# Patient Record
Sex: Female | Born: 1965
Health system: Southern US, Community
[De-identification: ages and names within clinical notes are randomized; demographics above are authoritative.]

## PROBLEM LIST (undated history)

## (undated) DIAGNOSIS — G629 Polyneuropathy, unspecified: Secondary | ICD-10-CM

## (undated) DIAGNOSIS — F329 Major depressive disorder, single episode, unspecified: Secondary | ICD-10-CM

## (undated) DIAGNOSIS — Z5189 Encounter for other specified aftercare: Secondary | ICD-10-CM

## (undated) DIAGNOSIS — A048 Other specified bacterial intestinal infections: Secondary | ICD-10-CM

## (undated) DIAGNOSIS — E1142 Type 2 diabetes mellitus with diabetic polyneuropathy: Secondary | ICD-10-CM

## (undated) DIAGNOSIS — F32A Depression, unspecified: Secondary | ICD-10-CM

## (undated) DIAGNOSIS — I10 Essential (primary) hypertension: Secondary | ICD-10-CM

## (undated) DIAGNOSIS — E119 Type 2 diabetes mellitus without complications: Secondary | ICD-10-CM

## (undated) HISTORY — PX: UMBILICAL HERNIA REPAIR: SHX196

## (undated) HISTORY — DX: Depression, unspecified: F32.A

## (undated) HISTORY — PX: TUBAL LIGATION: SHX77

## (undated) HISTORY — DX: Essential (primary) hypertension: I10

## (undated) HISTORY — DX: Polyneuropathy, unspecified: G62.9

## (undated) HISTORY — DX: Other specified bacterial intestinal infections: A04.8

## (undated) HISTORY — DX: Encounter for other specified aftercare: Z51.89

## (undated) HISTORY — DX: Major depressive disorder, single episode, unspecified: F32.9

## (undated) HISTORY — DX: Type 2 diabetes mellitus with diabetic polyneuropathy: E11.42

---

## 2017-02-02 HISTORY — PX: COLONOSCOPY: SHX174

## 2018-10-20 ENCOUNTER — Encounter: Payer: Self-pay | Admitting: Gastroenterology

## 2018-11-02 ENCOUNTER — Encounter: Payer: Self-pay | Admitting: Gastroenterology

## 2018-11-02 ENCOUNTER — Telehealth (INDEPENDENT_AMBULATORY_CARE_PROVIDER_SITE_OTHER): Payer: Managed Care, Other (non HMO) | Admitting: Gastroenterology

## 2018-11-02 ENCOUNTER — Other Ambulatory Visit: Payer: Self-pay

## 2018-11-02 ENCOUNTER — Encounter: Payer: Self-pay | Admitting: *Deleted

## 2018-11-02 ENCOUNTER — Telehealth: Payer: Self-pay | Admitting: Gastroenterology

## 2018-11-02 VITALS — Ht <= 58 in | Wt 190.0 lb

## 2018-11-02 DIAGNOSIS — R1012 Left upper quadrant pain: Secondary | ICD-10-CM

## 2018-11-02 DIAGNOSIS — K760 Fatty (change of) liver, not elsewhere classified: Secondary | ICD-10-CM

## 2018-11-02 DIAGNOSIS — K219 Gastro-esophageal reflux disease without esophagitis: Secondary | ICD-10-CM

## 2018-11-02 NOTE — Telephone Encounter (Signed)
Pt needs a letter for her work indicating that she had a virtual visit today, pls fax letter to [phone].

## 2018-11-02 NOTE — Telephone Encounter (Signed)
Letter faxed to number provided.

## 2018-11-02 NOTE — Patient Instructions (Signed)
To help prevent the possible spread of infection to our patients, communities, and staff; we will be implementing the following measures:  As of now we are not allowing any visitors/family members to accompany you to any upcoming appointments with [facility]. If you have any concerns about this please contact our office to discuss prior to the appointment.   Continue taking Protonix 40mg  by mouth daily.  Try to exercise with the intent of losing 6 pounds over the next 3 months.  You have been scheduled for an endoscopy. Please follow written instructions given to you at your visit today. If you use inhalers (even only as needed), please bring them with you on the day of your procedure. Your physician has requested that you go to www.startemmi.com and enter the access code given to you at your visit today. This web site gives a general overview about your procedure. However, you should still follow specific instructions given to you by our office regarding your preparation for the procedure.  Thank you,  Dr. Lynann Bologna

## 2018-11-02 NOTE — Progress Notes (Signed)
Chief Complaint:   Referring Provider:  [name], MD      ASSESSMENT AND PLAN;   #1.  LUQ abdominal pain #2.  GERD. #3.  Fatty liver. Has assoc DM2, obesity.  Plan: -EGD for further evaluation.  Have discussed risks and benefits. -Continue Protonix 40 mg p.o. QD. -If EGD is negative, proceed with CT Abdo/pelvis with p.o. and IV contrast. -If still with problems, solid-phase GES to r/o diabetic gastroparesis. -I have encouraged her to start exercising and try to reduce weight.  Aim is to reduce 6lb over the next 3 months. -Best possible control of diabetes. -FU in 12 weeks.  At FU, repeat LFTs, check wt, if with abn LFTs, proceed with hepatic elastography.    HPI:    Terry Maynard is a 53 y.o. female  With left upper quadrant abdominal pain, feels like a "bulge" when she bends. Associated with some nausea, burping, regurgitation.  No odynophagia or dysphagia.  Heartburn is better ever since she has been on Protonix. Advised to get EGD performed. No significant weight loss.  In fact she has gained some weight. Diabetes not under very good control.  Her last hemoglobin A1c was 8.5 on 10/20/2018.  No vomiting, melena or hematochezia, diarrhea or constipation.  Daughter acting as Equities trader.  Past GI procedures: -Colonoscopy 02/02/2017-mild sigmoid diverticulosis, good preparation, repeat in 10 years. -CT 08/2015 some inflammatory stranding along previous umbilical hernia repair, fatty liver, status post cholecystectomy, no acute abnormalities.  No small bowel obstruction. - Past Medical History:  Diagnosis Date  . Depression   . Diabetic peripheral neuropathy (HCC)   . Essential hypertension     Past Surgical History:  Procedure Laterality Date  . COLONOSCOPY  02/02/2017   Mild sigmoid diverticulosis. Otherwise, normal colonoscopy  . TUBAL LIGATION    . UMBILICAL HERNIA REPAIR      Family History  Problem Relation Age of Onset  . Diabetes Mother   .  Diabetes Father   . Colon cancer Neg Hx   . Esophageal cancer Neg Hx     Social History   Tobacco Use  . Smoking status: Never Smoker  . Smokeless tobacco: Never Used  Substance Use Topics  . Alcohol use: Not Currently  . Drug use: Never    Current Outpatient Medications  Medication Sig Dispense Refill  . Insulin Glargine (TOUJEO SOLOSTAR Terry Maynard) Inject into the skin.    Marland Kitchen lisinopril-hydrochlorothiazide (ZESTORETIC) 20-25 MG tablet Take 1 tablet by mouth daily.    . metFORMIN (GLUCOPHAGE) 500 MG tablet Take 1 tablet by mouth 2 (two) times daily.    . pantoprazole (PROTONIX) 40 MG tablet Take 40 mg by mouth daily.     No current facility-administered medications for this visit.     Not on File  Review of Systems:  Constitutional: Denies fever, chills, diaphoresis, appetite change and fatigue.  HEENT: Denies photophobia, eye pain, redness, hearing loss, ear pain, congestion, sore throat, rhinorrhea, sneezing, mouth sores, neck pain, neck stiffness and tinnitus.   Respiratory: Denies SOB, DOE, cough, chest tightness,  and wheezing.   Cardiovascular: Denies chest pain, palpitations and leg swelling.  Genitourinary: Denies dysuria, urgency, frequency, hematuria, flank pain and difficulty urinating.  Musculoskeletal: Denies myalgias, back pain, joint swelling, arthralgias and gait problem.  Skin: No rash.  Neurological: Denies dizziness, seizures, syncope, weakness, light-headedness, numbness and headaches.  Hematological: Denies adenopathy. Easy bruising, personal or family bleeding history  Psychiatric/Behavioral: No anxiety or depression  Physical Exam:    Ht 4\' 8"  (1.422 m)   Wt 190 lb (86.2 kg) Comment: verbalized by daughter  BMI 42.60 kg/m  Filed Weights   11/02/18 1002  Weight: 190 lb (86.2 kg)   Constitutional:  Well-developed, in no acute distress. Psychiatric: Normal mood and affect. Behavior is normal.  This service was provided via telemedicine.  The  patient was located at home.  The provider was located in office.  The patient did consent to this telephone visit and is aware of possible charges through their insurance for this visit.  The patient was referred by Dr. Leonor LivHolt.  The other persons participating in this telemedicine service were daughter and their role was care/interpretation.  Time spent on call/coordination of care: 25 min     [name], MD 11/02/2018, 10:55 AM  Cc: Marylen PontoHolt, Lynley S, MD

## 2018-11-10 ENCOUNTER — Telehealth: Payer: Self-pay | Admitting: *Deleted

## 2018-11-10 NOTE — Telephone Encounter (Signed)
Covid-19 screening questions  Have you traveled in the last 14 days? NO If yes where?  Do you now or have you had a fever in the last 14 days? NO  Do you have any respiratory symptoms of shortness of breath or cough now or in the last 14 days? NO  Do you have any family members or close contacts with diagnosed or suspected Covid-19 in the past 14 days? NO  Have you been tested for Covid-19 and found to be positive? NO  Pt told that care partner would wait in the car but we request that they stay on the premises.  Also asked pt to bring a mask and if they dont have one we will provide one.

## 2018-11-10 NOTE — Telephone Encounter (Signed)
Covid-19 screening questions  Have you traveled in the last 14 days? If yes where?  Do you now or have you had a fever in the last 14 days?  Do you have any respiratory symptoms of shortness of breath or cough now or in the last 14 days?  Do you have any family members or close contacts with diagnosed or suspected Covid-19 in the past 14 days?  Have you been tested for Covid-19 and found to be positive?  Pt told that care partner would wait in the car but we request that they stay on the premises.  Also asked pt to bring a mask and if they dont have one we will provide one.

## 2018-11-13 ENCOUNTER — Encounter: Payer: Self-pay | Admitting: Gastroenterology

## 2018-11-13 ENCOUNTER — Other Ambulatory Visit: Payer: Self-pay

## 2018-11-13 ENCOUNTER — Ambulatory Visit (AMBULATORY_SURGERY_CENTER): Payer: Managed Care, Other (non HMO) | Admitting: Gastroenterology

## 2018-11-13 VITALS — BP 115/72 | HR 72 | Temp 98.4°F | Resp 19 | Ht <= 58 in | Wt 189.0 lb

## 2018-11-13 DIAGNOSIS — B9681 Helicobacter pylori [H. pylori] as the cause of diseases classified elsewhere: Secondary | ICD-10-CM | POA: Diagnosis not present

## 2018-11-13 DIAGNOSIS — R1012 Left upper quadrant pain: Secondary | ICD-10-CM

## 2018-11-13 DIAGNOSIS — K2970 Gastritis, unspecified, without bleeding: Secondary | ICD-10-CM

## 2018-11-13 MED ORDER — SODIUM CHLORIDE 0.9 % IV SOLN: 500.0000 mL | Freq: Once | INTRAVENOUS | Status: DC

## 2018-11-13 NOTE — Progress Notes (Signed)
Called to room to assist during endoscopic procedure.  Patient ID and intended procedure confirmed with present staff. Received instructions for my participation in the procedure from the performing physician.  

## 2018-11-13 NOTE — Progress Notes (Signed)
PT taken to PACU. Monitors in place. VSS. Report given to RN. 

## 2018-11-13 NOTE — Op Note (Signed)
[facility] Patient Name: Terry Maynard Procedure Date: 11/13/2018 9:30 AM MRN: [id number] Endoscopist: Lynann Bologna , MD Age: 53 Referring MD:  Date of Birth: [birth year] Gender: Female Account #: [id number] Procedure:                Upper GI endoscopy Indications:              Abdominal pain in the left upper quadrant Medicines:                Monitored Anesthesia Care Procedure:                Pre-Anesthesia Assessment:                           - Prior to the procedure, a History and Physical                            was performed, and patient medications and                            allergies were reviewed. The patient's tolerance of                            previous anesthesia was also reviewed. The risks                            and benefits of the procedure and the sedation                            options and risks were discussed with the patient.                            All questions were answered, and informed consent                            was obtained. Prior Anticoagulants: The patient has                            taken no previous anticoagulant or antiplatelet                            agents. ASA Grade Assessment: III - A patient with                            severe systemic disease. After reviewing the risks                            and benefits, the patient was deemed in                            satisfactory condition to undergo the procedure.                           After obtaining informed consent, the endoscope was  passed under direct vision. Throughout the                            procedure, the patient's blood pressure, pulse, and                            oxygen saturations were monitored continuously. The                            Endoscope was introduced through the mouth, and                            advanced to the second part of duodenum. The upper                            GI  endoscopy was accomplished without difficulty.                            The patient tolerated the procedure well. Scope In: Scope Out: Findings:                 The examined esophagus was normal.                           The Z-line was regular and was found 35 cm from the                            incisors.                           Localized mild inflammation characterized by                            erythema was found in the gastric antrum. Biopsies                            were taken with a cold forceps for histology.                            Estimated blood loss: none.                           The examined duodenum was normal. Biopsies were                            taken with a cold forceps for histology. Estimated                            blood loss: none. Complications:            No immediate complications. Estimated Blood Loss:     Estimated blood loss: none. Impression:               -Mild gastritis. Recommendation:           - Patient has a contact number available for  emergencies. The signs and symptoms of potential                            delayed complications were discussed with the                            patient. Return to normal activities tomorrow.                            Written discharge instructions were provided to the                            patient.                           - Resume previous diet.                           - Continue present medications including Protonix                            40 mg p.o. once a day.                           - Avoid nonsteroidals if possible.                           - Await pathology results.                           - Return to GI clinic in 6 weeks. Lynann Bolognaajesh Yosgar Demirjian, MD 11/13/2018 9:45:12 AM This report has been signed electronically.

## 2018-11-13 NOTE — Patient Instructions (Signed)
Take your new medicine per day on an empty stomach. YOU HAD AN ENDOSCOPIC PROCEDURE TODAY AT THE [facility]:   Refer to the procedure report that was given to you for any specific questions about what was found during the examination.  If the procedure report does not answer your questions, please call your gastroenterologist to clarify.  If you requested that your care partner not be given the details of your procedure findings, then the procedure report has been included in a sealed envelope for you to review at your convenience later.  YOU SHOULD EXPECT: Some feelings of bloating in the abdomen. Passage of more gas than usual.  Walking can help get rid of the air that was put into your GI tract during the procedure and reduce the bloating.   Please Note:  You might notice some irritation and congestion in your nose or some drainage.  This is from the oxygen used during your procedure.  There is no need for concern and it should clear up in a day or so.  SYMPTOMS TO REPORT IMMEDIATELY:    Following upper endoscopy (EGD)  Vomiting of blood or coffee ground material  New chest pain or pain under the shoulder blades  Painful or persistently difficult swallowing  New shortness of breath  Fever of 100F or higher  Black, tarry-looking stools  For urgent or emergent issues, a gastroenterologist can be reached at any hour by calling [phone].   DIET:  We do recommend a small meal at first, but then you may proceed to your regular diet.  Drink plenty of fluids but you should avoid alcoholic beverages for 24 hours.  ACTIVITY:  You should plan to take it easy for the rest of today and you should NOT DRIVE or use heavy machinery until tomorrow (because of the sedation medicines used during the test).    FOLLOW UP: Our staff will call the number listed on your records 48-72 hours following your procedure to check on you and address any questions or concerns that you may have  regarding the information given to you following your procedure. If we do not reach you, we will leave a message.  We will attempt to reach you two times.  During this call, we will ask if you have developed any symptoms of COVID 19. If you develop any symptoms (ie: fever, flu-like symptoms, shortness of breath, cough etc.) before then, please call [phone].  If you test positive for Covid 19 in the 2 weeks post procedure, please call and report this information to [place].    If any biopsies were taken you will be contacted by phone or by letter within the next 1-3 weeks.  Please call us at [phone] if you have not heard about the biopsies in 3 weeks.    SIGNATURES/CONFIDENTIALITY: You and/or your care partner have signed paperwork which will be entered into your electronic medical record.  These signatures attest to the fact that that the information above on your After Visit Summary has been reviewed and is understood.  Full responsibility of the confidentiality of this discharge information lies with you and/or your care-partner.

## 2018-11-15 ENCOUNTER — Telehealth: Payer: Self-pay

## 2018-11-15 NOTE — Telephone Encounter (Deleted)
  Follow up Call-  Call back number 11/13/2018  Post procedure Call Back phone  # [phone] daughter ok to talk to her  Permission to leave phone message Yes     Patient questions:  Do you have a fever, pain , or abdominal swelling? {yes no:314532} Pain Score  {NUMBERS; 0-10:5044} *  Have you tolerated food without any problems? {yes no:314532}  Have you been able to return to your normal activities? {yes no:314532}  Do you have any questions about your discharge instructions: Diet   {yes no:314532} Medications  {yes no:314532} Follow up visit  {yes no:314532}  Do you have questions or concerns about your Care? {yes no:314532}  Actions: * If pain score is 4 or above: {ACTION; [facility] ENDO PAIN >4:21563::"No action needed, pain <4."}  1. Have you developed a fever since your procedure? ***  2.   Have you had an respiratory symptoms (SOB or cough) since your procedure? ***  3.   Have you tested positive for COVID 19 since your procedure ***  4.   Have you had any family members/close contacts diagnosed with the COVID 19 since your procedure?  ***   If yes to any of these questions please route to Laverna Peace, RN and Jennye Boroughs, RN.

## 2018-11-15 NOTE — Telephone Encounter (Signed)
First attempt follow up call made, no answer, left message for pt to call if any problems or covid 19 sx but that we would call back after noon today to check again.

## 2018-11-15 NOTE — Telephone Encounter (Signed)
Second follow up call attempt.  Reached voicemail with phone number identified.  Message left to call if any questions, concerns, or issues related to procedure or COVID-19.

## 2018-11-16 ENCOUNTER — Encounter: Payer: Self-pay | Admitting: Gastroenterology

## 2018-11-17 ENCOUNTER — Other Ambulatory Visit: Payer: Self-pay

## 2018-11-17 DIAGNOSIS — A048 Other specified bacterial intestinal infections: Secondary | ICD-10-CM

## 2018-11-17 MED ORDER — CLARITHROMYCIN 500 MG PO TABS: 500.0000 mg | ORAL_TABLET | Freq: Two times a day (BID) | ORAL | 0 refills | Status: DC

## 2018-11-17 MED ORDER — PANTOPRAZOLE SODIUM 40 MG PO TBEC: 40.0000 mg | DELAYED_RELEASE_TABLET | Freq: Every day | ORAL | 0 refills | Status: AC

## 2018-11-17 MED ORDER — METRONIDAZOLE 500 MG PO TABS: 500.0000 mg | ORAL_TABLET | Freq: Two times a day (BID) | ORAL | 0 refills | Status: DC

## 2018-11-17 MED ORDER — AMOXICILLIN 500 MG PO TABS: 1000.0000 mg | ORAL_TABLET | Freq: Two times a day (BID) | ORAL | 0 refills | Status: DC

## 2018-12-19 ENCOUNTER — Encounter: Payer: Self-pay | Admitting: Gastroenterology

## 2018-12-27 ENCOUNTER — Telehealth: Payer: Managed Care, Other (non HMO) | Admitting: Gastroenterology

## 2018-12-27 ENCOUNTER — Other Ambulatory Visit: Payer: Self-pay

## 2019-01-01 NOTE — Progress Notes (Signed)
H. Pylori ordered in Epic; letter to be translated for patient then to be mailed to the patient;

## 2019-01-16 ENCOUNTER — Telehealth: Payer: Self-pay | Admitting: Gastroenterology

## 2019-01-16 NOTE — Telephone Encounter (Signed)
Covid-19 Screening Questions ° °¿Tiene o ha tenido fiebre en los últimos 14 días?          NO °Do you now or have you had a fever in the last 14 days?  °¿Tiene algún síntoma respiratorio de dificultad para respirar o tos ahora o en los últimos 14 días?        NO °Do you have any respiratory symptoms of shortness of breath or cough now or in the last 14 days?         °¿Tiene algún familiar o contacto cercano con Covid-19 diagnosticado o sospechoso en los últimos 14 días?         NO °Do you have any family members or close contacts with diagnosed or suspected Covid-19 in the past 14 days?      °¿Te han hecho la prueba de Covid-19 y has encontrado que es positivo?      NO °Have you been tested for Covid-19 and found to be positive? °Se le pidió a la paciente que trajera a alguien que la ayudara a interpretar durante su visita de mañana. Tanto la intérprete como ella misma deben ingresar al edificio con una máscara. °Patient was asked to bring someone to help interpreter for her. Both interpreter and herself should enter the building wearing a mask. °

## 2019-01-17 ENCOUNTER — Other Ambulatory Visit: Payer: Self-pay

## 2019-01-17 ENCOUNTER — Ambulatory Visit: Payer: Managed Care, Other (non HMO) | Admitting: Gastroenterology

## 2019-01-17 ENCOUNTER — Encounter: Payer: Self-pay | Admitting: Gastroenterology

## 2019-01-17 VITALS — BP 134/90 | HR 89 | Temp 98.2°F | Ht 61.0 in | Wt 186.5 lb

## 2019-01-17 DIAGNOSIS — K760 Fatty (change of) liver, not elsewhere classified: Secondary | ICD-10-CM | POA: Diagnosis not present

## 2019-01-17 DIAGNOSIS — R1012 Left upper quadrant pain: Secondary | ICD-10-CM | POA: Diagnosis not present

## 2019-01-17 DIAGNOSIS — K219 Gastro-esophageal reflux disease without esophagitis: Secondary | ICD-10-CM

## 2019-01-17 NOTE — Progress Notes (Signed)
Chief Complaint: FU  Referring Provider:  Ronita Hipps, MD      ASSESSMENT AND PLAN;   #1.  LUQ abdominal pain. Neg EGD 11/2018 except for HP gastritis. Treated.  Could have musculoskeletal component as well. #2.  GERD. #3.  Fatty liver. Has assoc DM2, obesity.  Plan: -Continue Protonix 40 mg p.o. QD. -CT Abdo/pelvis with p.o. and IV contrast. -If still with problems, solid-phase GES to r/o diabetic gastroparesis. -Colace 1 tab po qd. -I have encouraged her to start exercising and try to reduce weight.  Aim is to reduce 6lb over the next 3 months. -Best possible control of diabetes. -Check CBC, CMP and lipase. -Heating pads and icy hot for musculoskeltal component. -FU in 12 weeks.     HPI:    Terry Maynard is a 53 y.o. female  With left upper quadrant abdominal pain, feels like a "bulge" when she bends.  Has some associated constipation.  She does admit that if she has good bowel movements, the pain does get better.  Also has been having abdominal pain of a different kind when she she moves from side to side.  Associated with some nausea, burping, regurgitation.  No odynophagia or dysphagia.  Heartburn is better ever since she has been on Protonix.  No significant weight loss.  In fact she has gained some weight.  Diabetes not under very good control.  Her last hemoglobin A1c was 8.5 on 10/20/2018.  No vomiting, melena or hematochezia, diarrhea.  History through interpreter.  Past GI procedures: -Colonoscopy 02/02/2017-mild sigmoid diverticulosis, good preparation, repeat in 10 years. -CT 08/2015 some inflammatory stranding along previous umbilical hernia repair, fatty liver, status post cholecystectomy, no acute abnormalities.  No small bowel obstruction. -EGD6/2020: HP gastritis.  Treated with triple drug therapy. Past Medical History:  Diagnosis Date  . Blood transfusion without reported diagnosis   . Depression   . Diabetic peripheral neuropathy ([place])    . Essential hypertension   . H. pylori infection   . Peripheral neuropathy     Past Surgical History:  Procedure Laterality Date  . COLONOSCOPY  02/02/2017   Mild sigmoid diverticulosis. Otherwise, normal colonoscopy  . TUBAL LIGATION    . UMBILICAL HERNIA REPAIR      Family History  Problem Relation Age of Onset  . Diabetes Mother   . Diabetes Father   . Colon cancer Neg Hx   . Esophageal cancer Neg Hx   . Colon polyps Neg Hx   . Rectal cancer Neg Hx   . Stomach cancer Neg Hx     Social History   Tobacco Use  . Smoking status: Never Smoker  . Smokeless tobacco: Never Used  Substance Use Topics  . Alcohol use: Not Currently  . Drug use: Never    Current Outpatient Medications  Medication Sig Dispense Refill  . FARXIGA 5 MG TABS tablet Take 5 mg by mouth every morning.    Marland Kitchen lisinopril-hydrochlorothiazide (ZESTORETIC) 20-25 MG tablet Take 1 tablet by mouth daily.    . metFORMIN (GLUCOPHAGE) 500 MG tablet Take 1 tablet by mouth 2 (two) times daily.    . pantoprazole (PROTONIX) 40 MG tablet Take 1 tablet (40 mg total) by mouth daily. 28 tablet 0   No current facility-administered medications for this visit.     No Known Allergies  Review of Systems:  neg     Physical Exam:    BP 134/90   Pulse 89   Temp 98.2 F (36.8  C)   Ht 5\' 1"  (1.549 m)   Wt 186 lb 8 oz (84.6 kg)   BMI 35.24 kg/m  Filed Weights   01/17/19 1544  Weight: 186 lb 8 oz (84.6 kg)  Wearing mask Lungs: Clear. CVS: Normal heart sounds, no murmurs. Abdominal exam: Left upper quadrant reproducible musculoskeletal tenderness.  Bowel sounds are present.  Minimal lower abdominal tenderness.  No rebound.  Bowel sounds are present. Extremities-no edema. Examined in presence of interpreter.  Time spent on call/coordination of care: 15 min     Edman Circleaj Annaliesa Blann, MD 01/17/2019, 4:09 PM  Cc: Marylen PontoHolt, Lynley S, MD

## 2019-01-17 NOTE — Patient Instructions (Addendum)
If you are age 55 or older, your body mass index should be between 23-30. Your Body mass index is 35.24 kg/m. If this is out of the aforementioned range listed, please consider follow up with your Primary Care Provider.  If you are age 32 or younger, your body mass index should be between 19-25. Your Body mass index is 35.24 kg/m. If this is out of the aformentioned range listed, please consider follow up with your Primary Care Provider.    You have [facility] [place] [address] [facility]   You are scheduled on 01/25/19 at [place] should arrive 15 minutes prior to your appointment time for registration. Please follow the written instructions below on the day of your exam:  WARNING: IF YOU ARE ALLERGIC TO IODINE/X-RAY DYE, PLEASE NOTIFY RADIOLOGY IMMEDIATELY AT [phone]! YOU WILL BE GIVEN A 13 HOUR PREMEDICATION PREP.  1) Do not eat or drink anything after 7am (4 hours prior to your test) 2) You have been given 2 bottles of oral contrast to drink. The solution may taste better if refrigerated, but do NOT add ice or any other liquid to this solution. Shake well before drinking.    Drink 1 bottle of contrast @ 9am (2 hours prior to your exam)  Drink 1 bottle of contrast @ 10am (1 hour prior to your exam)  You may take any medications as prescribed with a small amount of water, if necessary. If you take any of the following medications: METFORMIN, GLUCOPHAGE, GLUCOVANCE, AVANDAMET, RIOMET, FORTAMET, [place] MET, JANUMET, GLUMETZA or METAGLIP, you MAY be asked to HOLD this medication 48 hours AFTER the exam.  The purpose of you drinking the oral contrast is to aid in the visualization of your intestinal tract. The contrast solution may cause some diarrhea. Depending on your individual set of symptoms, you may also receive an intravenous injection of x-ray contrast/dye. Plan on being at  [facility] for 30 minutes or longer, depending on the type of exam you are having performed.  This test typically takes 30-45 minutes to complete.  If you have any questions regarding your exam or if you need to reschedule, you may call the CT department at [phone] between the hours of 8:00 am and 5:00 pm, Monday-Friday.  ________________________________________________________________________  Please go to the lab at [facility] ([place] You will need to go to level "B", you do not need an appointment for this. Hours available are 7:30 am - 4:30 pm.   Use heating pad at night and Icy Hot during day.  Try to reduce your weight by 6 lbs in the next 3 months.   Follow up 12 weeks.   Please purchase the following medications over the counter and take as directed: Colace once daily.   Thank you,  Dr. Jackquline Denmark

## 2019-01-24 ENCOUNTER — Other Ambulatory Visit (INDEPENDENT_AMBULATORY_CARE_PROVIDER_SITE_OTHER): Payer: Managed Care, Other (non HMO)

## 2019-01-24 DIAGNOSIS — R1012 Left upper quadrant pain: Secondary | ICD-10-CM | POA: Diagnosis not present

## 2019-01-24 DIAGNOSIS — K219 Gastro-esophageal reflux disease without esophagitis: Secondary | ICD-10-CM | POA: Diagnosis not present

## 2019-01-24 DIAGNOSIS — K760 Fatty (change of) liver, not elsewhere classified: Secondary | ICD-10-CM

## 2019-01-24 DIAGNOSIS — A048 Other specified bacterial intestinal infections: Secondary | ICD-10-CM

## 2019-01-24 LAB — CBC WITH DIFFERENTIAL/PLATELET
Basophils Absolute: 0 10*3/uL (ref 0.0–0.1)
Basophils Relative: 0.6 % (ref 0.0–3.0)
Eosinophils Absolute: 0.1 10*3/uL (ref 0.0–0.7)
Eosinophils Relative: 2.8 % (ref 0.0–5.0)
HCT: 44.7 % (ref 36.0–46.0)
Hemoglobin: 14.8 g/dL (ref 12.0–15.0)
Lymphocytes Relative: 31.7 % (ref 12.0–46.0)
Lymphs Abs: 1.7 10*3/uL (ref 0.7–4.0)
MCHC: 33 g/dL (ref 30.0–36.0)
MCV: 86.5 fl (ref 78.0–100.0)
Monocytes Absolute: 0.3 10*3/uL (ref 0.1–1.0)
Monocytes Relative: 6.2 % (ref 3.0–12.0)
Neutro Abs: 3.1 10*3/uL (ref 1.4–7.7)
Neutrophils Relative %: 58.7 % (ref 43.0–77.0)
Platelets: 181 10*3/uL (ref 150.0–400.0)
RBC: 5.17 Mil/uL — ABNORMAL HIGH (ref 3.87–5.11)
RDW: 13.1 % (ref 11.5–15.5)
WBC: 5.3 10*3/uL (ref 4.0–10.5)

## 2019-01-24 LAB — COMPREHENSIVE METABOLIC PANEL
ALT: 33 U/L (ref 0–35)
AST: 26 U/L (ref 0–37)
Albumin: 4.3 g/dL (ref 3.5–5.2)
Alkaline Phosphatase: 70 U/L (ref 39–117)
BUN: 16 mg/dL (ref 6–23)
CO2: 29 mEq/L (ref 19–32)
Calcium: 9.6 mg/dL (ref 8.4–10.5)
Chloride: 103 mEq/L (ref 96–112)
Creatinine, Ser: 0.67 mg/dL (ref 0.40–1.20)
GFR: 91.42 mL/min (ref >60.00)
Glucose, Bld: 153 mg/dL — ABNORMAL HIGH (ref 70–99)
Potassium: 3.8 mEq/L (ref 3.5–5.1)
Sodium: 139 mEq/L (ref 135–145)
Total Bilirubin: 0.4 mg/dL (ref 0.2–1.2)
Total Protein: 7.4 g/dL (ref 6.0–8.3)

## 2019-01-24 LAB — LIPASE: Lipase: 20 U/L (ref 11.0–59.0)

## 2019-01-25 ENCOUNTER — Other Ambulatory Visit: Payer: Self-pay

## 2019-01-25 ENCOUNTER — Ambulatory Visit (HOSPITAL_BASED_OUTPATIENT_CLINIC_OR_DEPARTMENT_OTHER)
Admission: RE | Admit: 2019-01-25 | Discharge: 2019-01-25 | Disposition: A | Discharge status: Home / Self Care | Payer: Managed Care, Other (non HMO) | Source: Ambulatory Visit | Attending: Gastroenterology | Admitting: Gastroenterology

## 2019-01-25 ENCOUNTER — Encounter (HOSPITAL_BASED_OUTPATIENT_CLINIC_OR_DEPARTMENT_OTHER): Payer: Self-pay

## 2019-01-25 DIAGNOSIS — R1012 Left upper quadrant pain: Secondary | ICD-10-CM | POA: Diagnosis not present

## 2019-01-25 DIAGNOSIS — K760 Fatty (change of) liver, not elsewhere classified: Secondary | ICD-10-CM | POA: Insufficient documentation

## 2019-01-25 DIAGNOSIS — K219 Gastro-esophageal reflux disease without esophagitis: Secondary | ICD-10-CM | POA: Insufficient documentation

## 2019-01-25 HISTORY — DX: Type 2 diabetes mellitus without complications: E11.9

## 2019-01-25 LAB — HELICOBACTER PYLORI  SPECIAL ANTIGEN
MICRO NUMBER:: 763880
SPECIMEN QUALITY: ADEQUATE

## 2019-01-25 MED ORDER — IOHEXOL 300 MG/ML  SOLN
100.0000 mL | Freq: Once | INTRAMUSCULAR | Status: AC | PRN
  Administered 2019-01-25: 100 mL via INTRAVENOUS

## 2019-02-06 ENCOUNTER — Other Ambulatory Visit: Payer: Self-pay

## 2019-02-06 DIAGNOSIS — R197 Diarrhea, unspecified: Secondary | ICD-10-CM

## 2019-02-06 DIAGNOSIS — R1012 Left upper quadrant pain: Secondary | ICD-10-CM

## 2019-02-06 DIAGNOSIS — K760 Fatty (change of) liver, not elsewhere classified: Secondary | ICD-10-CM

## 2019-02-06 DIAGNOSIS — K219 Gastro-esophageal reflux disease without esophagitis: Secondary | ICD-10-CM

## 2019-02-09 NOTE — Progress Notes (Signed)
Called and scheduled the patient's gastric emptying study at [facility] on 02/20/2019 at 7:30 am ; patient is to arrive at 7:15am, cannot take meds after midnight (especially stomach medications); patient is not to eat or drink after midnight as well; patient needs to be informed that this test is 4 hours in length;  Please inform the patient that she can call [phone] if she is unable to do this date/time;

## 2019-02-20 ENCOUNTER — Other Ambulatory Visit: Payer: Self-pay

## 2019-02-20 ENCOUNTER — Encounter (HOSPITAL_COMMUNITY)
Admission: RE | Admit: 2019-02-20 | Discharge: 2019-02-20 | Disposition: A | Discharge status: Home / Self Care | Payer: Managed Care, Other (non HMO) | Source: Ambulatory Visit | Attending: Gastroenterology | Admitting: Gastroenterology

## 2019-02-20 DIAGNOSIS — K760 Fatty (change of) liver, not elsewhere classified: Secondary | ICD-10-CM | POA: Diagnosis present

## 2019-02-20 DIAGNOSIS — R197 Diarrhea, unspecified: Secondary | ICD-10-CM | POA: Diagnosis not present

## 2019-02-20 DIAGNOSIS — K219 Gastro-esophageal reflux disease without esophagitis: Secondary | ICD-10-CM | POA: Diagnosis present

## 2019-02-20 DIAGNOSIS — R1012 Left upper quadrant pain: Secondary | ICD-10-CM | POA: Insufficient documentation

## 2019-02-20 MED ORDER — TECHNETIUM TC 99M SULFUR COLLOID
1.9000 | Freq: Once | INTRAVENOUS | Status: AC | PRN
  Administered 2019-02-20: 09:00:00 1.9 via INTRAVENOUS

## 2019-06-25 DIAGNOSIS — R1032 Left lower quadrant pain: Secondary | ICD-10-CM | POA: Diagnosis not present

## 2019-06-25 DIAGNOSIS — M25512 Pain in left shoulder: Secondary | ICD-10-CM | POA: Diagnosis not present

## 2019-06-25 DIAGNOSIS — M25511 Pain in right shoulder: Secondary | ICD-10-CM | POA: Diagnosis not present

## 2019-06-26 DIAGNOSIS — Z20828 Contact with and (suspected) exposure to other viral communicable diseases: Secondary | ICD-10-CM | POA: Diagnosis not present

## 2019-06-26 DIAGNOSIS — R1032 Left lower quadrant pain: Secondary | ICD-10-CM | POA: Diagnosis not present

## 2019-06-26 DIAGNOSIS — Z1152 Encounter for screening for COVID-19: Secondary | ICD-10-CM | POA: Diagnosis not present

## 2019-07-12 DIAGNOSIS — R109 Unspecified abdominal pain: Secondary | ICD-10-CM | POA: Diagnosis not present

## 2019-07-12 DIAGNOSIS — R197 Diarrhea, unspecified: Secondary | ICD-10-CM | POA: Diagnosis not present

## 2019-07-12 DIAGNOSIS — R1110 Vomiting, unspecified: Secondary | ICD-10-CM | POA: Diagnosis not present

## 2019-07-13 DIAGNOSIS — R109 Unspecified abdominal pain: Secondary | ICD-10-CM | POA: Diagnosis not present

## 2019-07-16 DIAGNOSIS — R112 Nausea with vomiting, unspecified: Secondary | ICD-10-CM | POA: Diagnosis not present

## 2019-07-16 DIAGNOSIS — R1013 Epigastric pain: Secondary | ICD-10-CM | POA: Diagnosis not present

## 2019-07-16 DIAGNOSIS — R197 Diarrhea, unspecified: Secondary | ICD-10-CM | POA: Diagnosis not present

## 2019-07-17 ENCOUNTER — Telehealth: Payer: Self-pay | Admitting: Gastroenterology

## 2019-07-17 DIAGNOSIS — R112 Nausea with vomiting, unspecified: Secondary | ICD-10-CM

## 2019-07-17 DIAGNOSIS — R1012 Left upper quadrant pain: Secondary | ICD-10-CM

## 2019-07-17 DIAGNOSIS — R197 Diarrhea, unspecified: Secondary | ICD-10-CM

## 2019-07-17 NOTE — Telephone Encounter (Signed)
Hi Briana, we received an urgent referral from pt's PCP for nausea, vomiting and diarrhea. I spoke with pt and she stated that she had Covid 3 weeks ago and developed this sxs. Her PCP told her that sxs should have resolved once she was cured from Covid so she wants pt to be seen asap. Dr. Chales Abrahams does not have anything until 2/16. Pls advise. Pt speaks Spanish so let me know if you need me to call her. Thank you.

## 2019-07-17 NOTE — Telephone Encounter (Signed)
Please review previous message and advise of next step-as OV notation recommendation was GES

## 2019-07-20 NOTE — Telephone Encounter (Signed)
Proceed with GES Needs clinic appointment thereafter in 2-3 weeks-can see Colleen or me  RG

## 2019-07-23 NOTE — Telephone Encounter (Signed)
Order has been placed for gastric emptying study; procedure has been scheduled at [facility] on 08/06/2019 at 7:30 am; patient cannot eat, drink ,or take any medications after midnight;  This test is purposely scheduled early because it is a 4 hr long test;   Please call and inform the patient of this information -if the patient is unable to complete the test on this date please have her call [phone] to reschedule

## 2019-07-24 DIAGNOSIS — N9489 Other specified conditions associated with female genital organs and menstrual cycle: Secondary | ICD-10-CM | POA: Diagnosis not present

## 2019-07-24 DIAGNOSIS — R1032 Left lower quadrant pain: Secondary | ICD-10-CM | POA: Diagnosis not present

## 2019-08-06 ENCOUNTER — Encounter (HOSPITAL_COMMUNITY)
Admission: RE | Admit: 2019-08-06 | Discharge: 2019-08-06 | Disposition: A | Discharge status: Home / Self Care | Payer: BC Managed Care – PPO | Source: Ambulatory Visit | Attending: Gastroenterology | Admitting: Gastroenterology

## 2019-08-06 ENCOUNTER — Other Ambulatory Visit: Payer: Self-pay

## 2019-08-06 DIAGNOSIS — R1012 Left upper quadrant pain: Secondary | ICD-10-CM

## 2019-08-06 DIAGNOSIS — R109 Unspecified abdominal pain: Secondary | ICD-10-CM | POA: Diagnosis not present

## 2019-08-06 DIAGNOSIS — R112 Nausea with vomiting, unspecified: Secondary | ICD-10-CM | POA: Insufficient documentation

## 2019-08-06 DIAGNOSIS — R197 Diarrhea, unspecified: Secondary | ICD-10-CM | POA: Insufficient documentation

## 2019-08-06 MED ORDER — TECHNETIUM TC 99M SULFUR COLLOID
1.9000 | Freq: Once | INTRAVENOUS | Status: AC | PRN
  Administered 2019-08-06: 1.9 via INTRAVENOUS

## 2019-08-24 ENCOUNTER — Encounter: Payer: Self-pay | Admitting: Gastroenterology

## 2019-08-24 ENCOUNTER — Ambulatory Visit: Payer: BC Managed Care – PPO | Admitting: Gastroenterology

## 2019-08-24 ENCOUNTER — Other Ambulatory Visit: Payer: Self-pay

## 2019-08-24 VITALS — BP 136/88 | HR 77 | Ht 60.0 in | Wt 180.5 lb

## 2019-08-24 DIAGNOSIS — K219 Gastro-esophageal reflux disease without esophagitis: Secondary | ICD-10-CM

## 2019-08-24 DIAGNOSIS — R1012 Left upper quadrant pain: Secondary | ICD-10-CM

## 2019-08-24 DIAGNOSIS — K760 Fatty (change of) liver, not elsewhere classified: Secondary | ICD-10-CM

## 2019-08-24 NOTE — Patient Instructions (Signed)
Continue Protonix 40 mg daily  Try to loose 6 pounds over the next 3-6 months  Follow up in 6 months  Thank you,  Dr. Lynann Bologna

## 2019-08-24 NOTE — Progress Notes (Signed)
Chief Complaint: FU  Referring Provider:  Ronita Hipps, MD      ASSESSMENT AND PLAN;   #1.  LUQ abdominal pain. Neg EGD 11/2018 except for HP gastritis. Treated.  Could have musculoskeletal component as well. Neg CT AP 01/25/2019 and GES 08/06/2019 #2.  GERD with small HH. #3.  Fatty liver with normal LFTs. Has assoc DM2, obesity.  Plan: -Continue Protonix 40 mg p.o. QD.  Can try it every other day. -I have encouraged her to start exercising and try to reduce weight.  Aim is to reduce 6lb over the next 3-6 months. -Best possible control of diabetes. -Heating pads and icy hot for musculoskeltal component. -FU in 6 months    HPI:    Terry Maynard is a 54 y.o. female  For follow-up visit History through the interpreter Feels much better  Has lost 9 pounds over last 1 year.  Eating healthy and better.  Only minimal abdominal discomfort.  Minimal constipation but much better.  We have gone over CT scan Abdo/pelvis in detail and gastric emptying scan as well.  I have encouraged her to lose weight gradually.   With left upper quadrant abdominal pain, feels like a "bulge" when she bends.    No vomiting, melena or hematochezia, diarrhea.  History through interpreter.   Wt Readings from Last 3 Encounters:  08/24/19 180 lb 8 oz (81.9 kg)  01/17/19 186 lb 8 oz (84.6 kg)  11/13/18 189 lb (85.7 kg)     Past GI procedures: -Colonoscopy 02/02/2017-mild sigmoid diverticulosis, good preparation, repeat in 10 years. -CT 08/2015 some inflammatory stranding along previous umbilical hernia repair, fatty liver, status post cholecystectomy, no acute abnormalities.  No small bowel obstruction. -EGD6/2020: HP gastritis.  Treated with triple drug therapy. -CT 01/2019: neg except for fatty liver -Gastric emptying scan 07/2019: Normal  Past Medical History:  Diagnosis Date  . Blood transfusion without reported diagnosis   . Depression   . Diabetes mellitus without  complication ([place])   . Diabetic peripheral neuropathy ([place])   . Essential hypertension   . H. pylori infection   . Peripheral neuropathy     Past Surgical History:  Procedure Laterality Date  . COLONOSCOPY  02/02/2017   Mild sigmoid diverticulosis. Otherwise, normal colonoscopy  . TUBAL LIGATION    . UMBILICAL HERNIA REPAIR      Family History  Problem Relation Age of Onset  . Diabetes Mother   . Diabetes Father   . Colon cancer Neg Hx   . Esophageal cancer Neg Hx   . Colon polyps Neg Hx   . Rectal cancer Neg Hx   . Stomach cancer Neg Hx     Social History   Tobacco Use  . Smoking status: Never Smoker  . Smokeless tobacco: Never Used  Substance Use Topics  . Alcohol use: Not Currently  . Drug use: Never    Current Outpatient Medications  Medication Sig Dispense Refill  . FARXIGA 5 MG TABS tablet Take 5 mg by mouth every morning.    Marland Kitchen lisinopril-hydrochlorothiazide (ZESTORETIC) 20-25 MG tablet Take 1 tablet by mouth daily.    . metFORMIN (GLUCOPHAGE) 500 MG tablet Take 1 tablet by mouth 2 (two) times daily.    . pantoprazole (PROTONIX) 40 MG tablet Take 1 tablet (40 mg total) by mouth daily. 28 tablet 0  . meloxicam (MOBIC) 7.5 MG tablet Take 7.5-15 mg by mouth daily as needed.     No current facility-administered medications for this  visit.    No Known Allergies  Review of Systems:  neg     Physical Exam:    BP 136/88   Pulse 77   Ht 5' (1.524 m)   Wt 180 lb 8 oz (81.9 kg)   BMI 35.25 kg/m  Filed Weights   08/24/19 1543  Weight: 180 lb 8 oz (81.9 kg)  Wearing mask Lungs: Clear. CVS: Normal heart sounds, no murmurs. Abdominal exam: Left upper quadrant reproducible musculoskeletal tenderness.  Bowel sounds are present.  Minimal lower abdominal tenderness.  No rebound.  Bowel sounds are present. Extremities-no edema. Examined in presence of interpreter.  Time spent on call/coordination of care: 25 min     Edman Circle, MD 08/24/2019, 4:06  PM  Cc: [name], MD

## 2019-12-08 DIAGNOSIS — H00011 Hordeolum externum right upper eyelid: Secondary | ICD-10-CM | POA: Diagnosis not present

## 2020-02-04 DIAGNOSIS — Z0000 Encounter for general adult medical examination without abnormal findings: Secondary | ICD-10-CM | POA: Diagnosis not present

## 2020-02-04 DIAGNOSIS — Z131 Encounter for screening for diabetes mellitus: Secondary | ICD-10-CM | POA: Diagnosis not present

## 2020-02-04 DIAGNOSIS — Z6836 Body mass index (BMI) 36.0-36.9, adult: Secondary | ICD-10-CM | POA: Diagnosis not present

## 2020-02-04 DIAGNOSIS — E1142 Type 2 diabetes mellitus with diabetic polyneuropathy: Secondary | ICD-10-CM | POA: Diagnosis not present

## 2020-02-04 DIAGNOSIS — Z13220 Encounter for screening for lipoid disorders: Secondary | ICD-10-CM | POA: Diagnosis not present

## 2020-09-15 IMAGING — CT CT ABDOMEN AND PELVIS WITH CONTRAST
2 of 3 series · 14 of 42 positions shown, 18 images · IV contrast (omnipaque)
Comparison: 08/21/2015

CLINICAL DATA: Left upper quadrant bulging and pain for 2 years.

EXAM:
CT ABDOMEN AND PELVIS WITH CONTRAST
TECHNIQUE: Multidetector CT imaging of the abdomen and pelvis was performed
using the standard protocol following bolus administration of
intravenous contrast.
CONTRAST:  100mL OMNIPAQUE IOHEXOL 300 MG/ML  SOLN

[Series 2: axial st · axial · 0.98mm/px · z∈[-440,-10]mm · 11 of 101 slices shown, 15 images]
[im 10/101  soft-tissue]
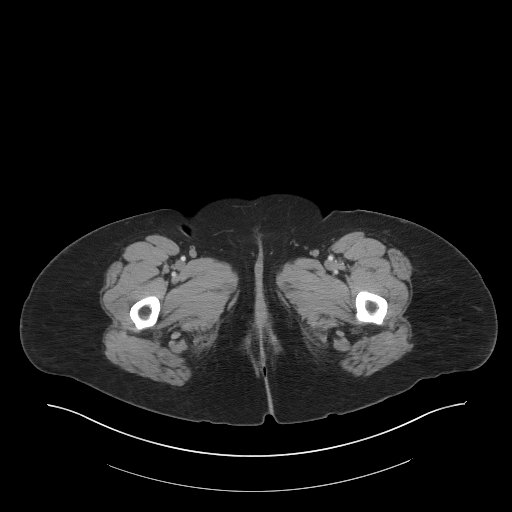
[im 10/101  bone]
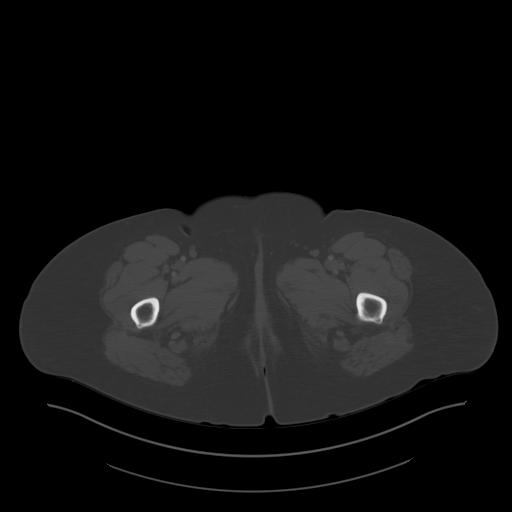
[im 19/101  soft-tissue]
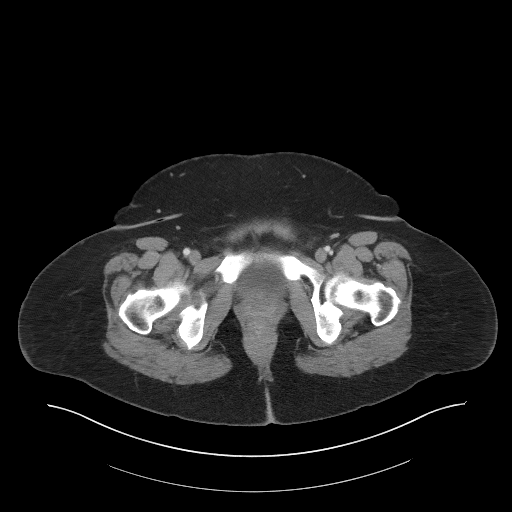
[im 28/101  soft-tissue]
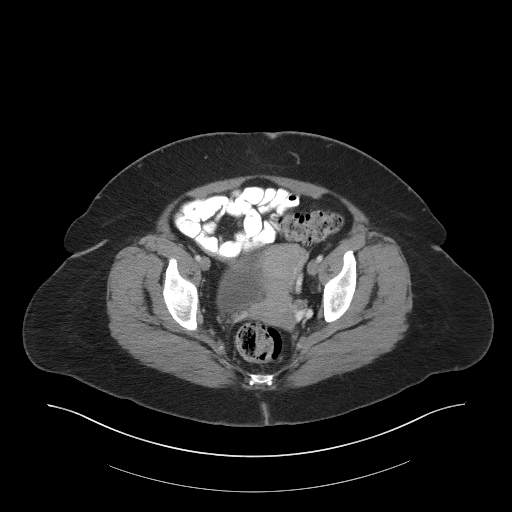
[im 41/101  soft-tissue]
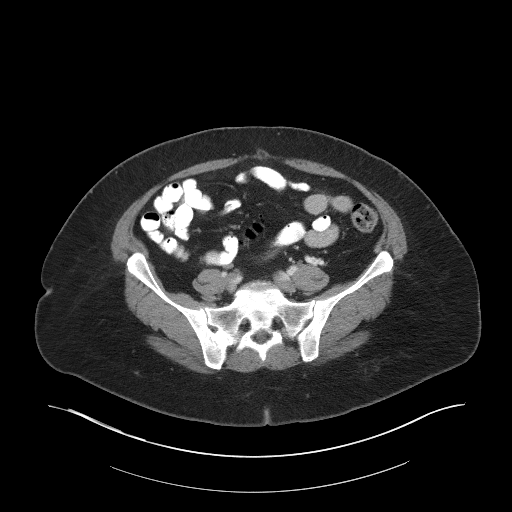
[im 51/101  soft-tissue]
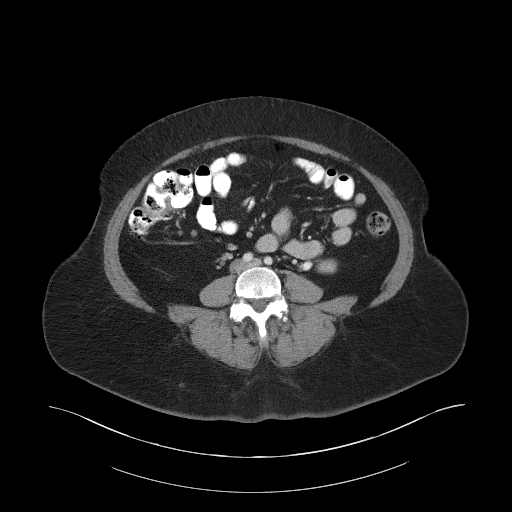
[im 60/101  soft-tissue]
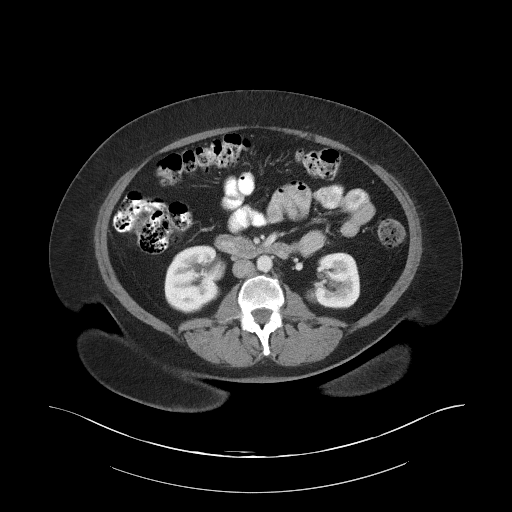
[im 73/101  soft-tissue]
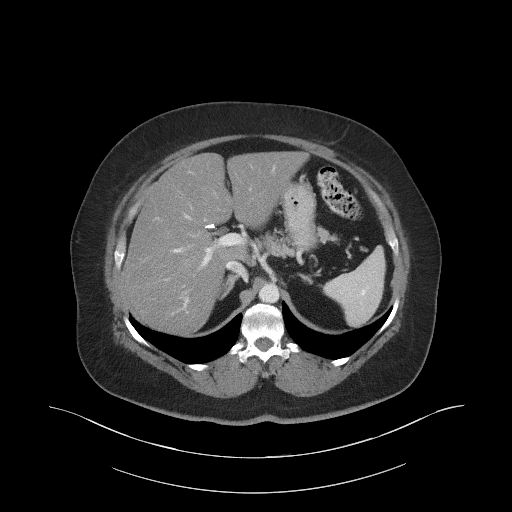
[im 82/101  soft-tissue]
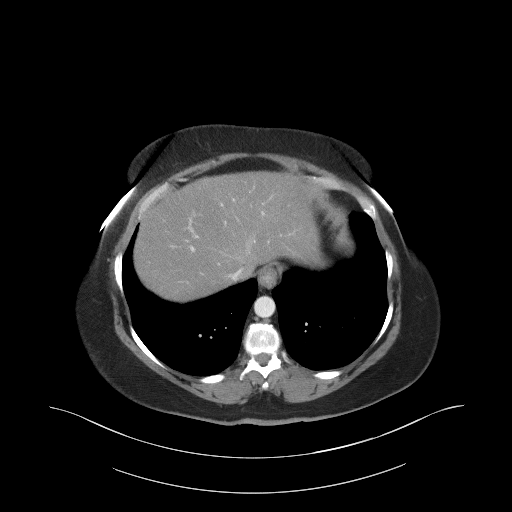
[im 82/101  lung]
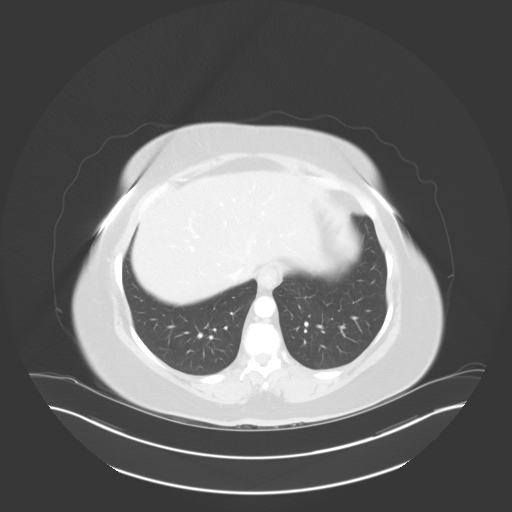
[im 87/101  lung]
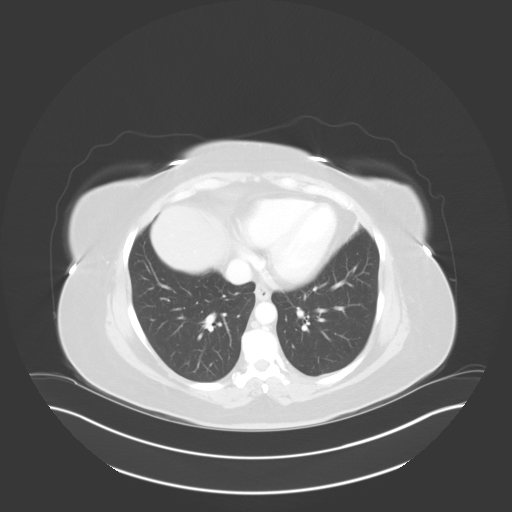
[im 91/101  soft-tissue]
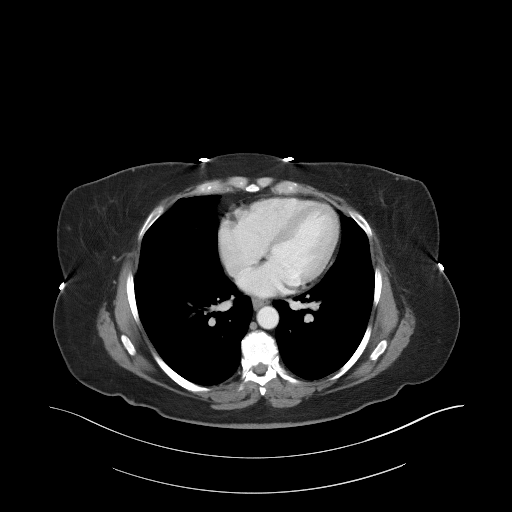
[im 91/101  lung]
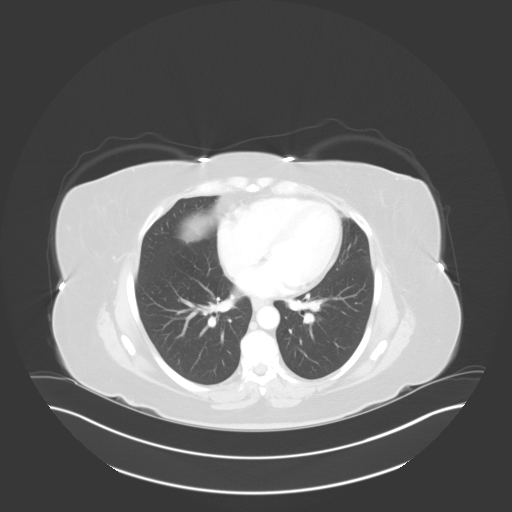
[im 91/101  bone]
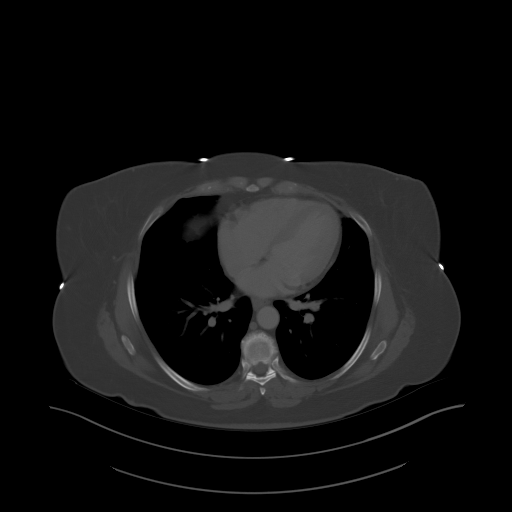
[im 96/101  lung]
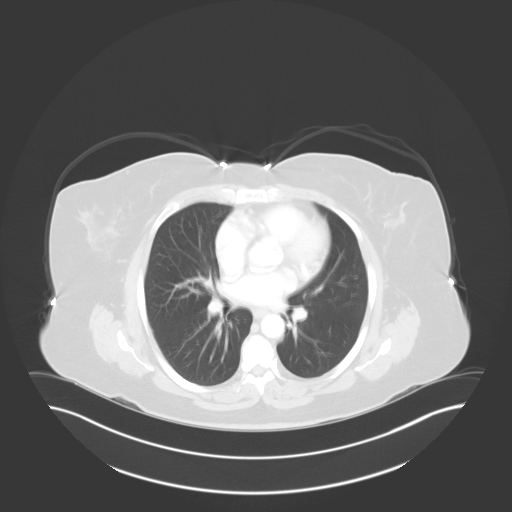

[Series 5: coronal st · coronal · 0.85mm/px · 3 of 110 slices shown]
[im 37/110  soft-tissue]
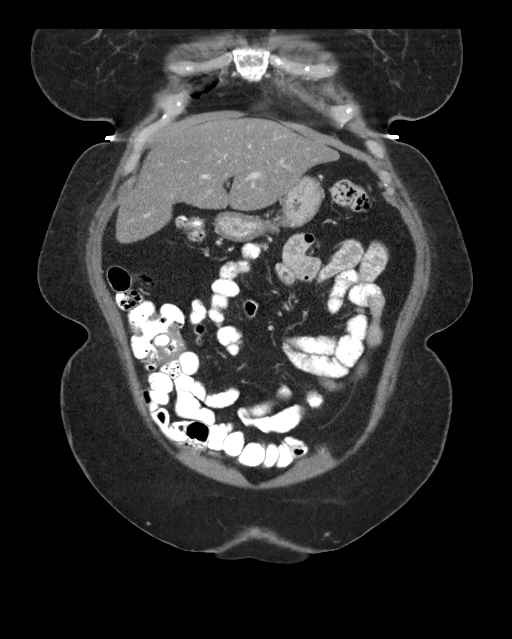
[im 49/110  soft-tissue]
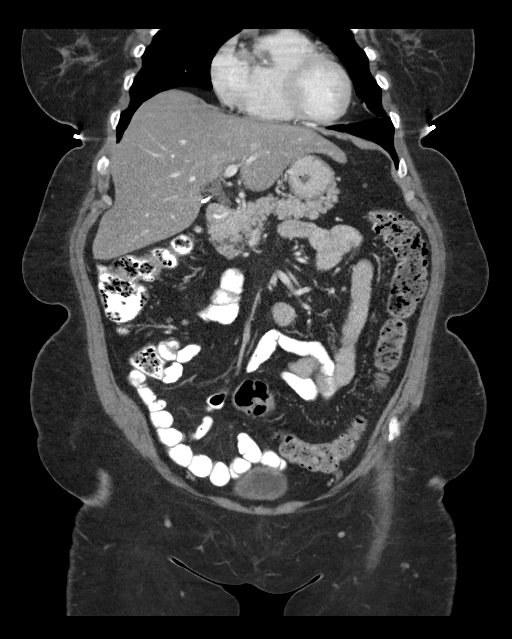
[im 61/110  soft-tissue]
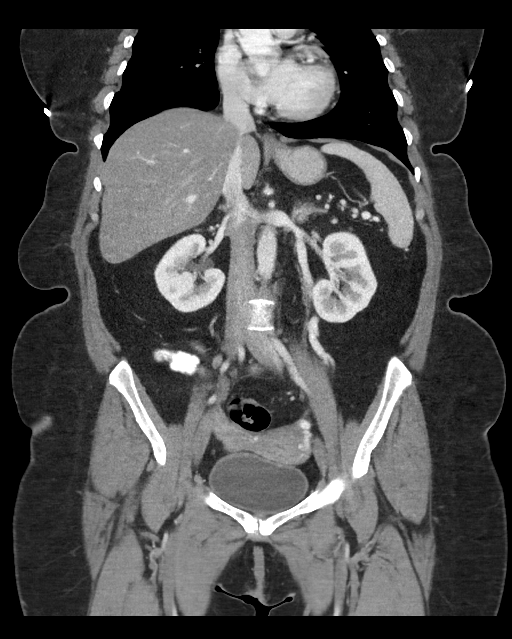

[14 of 42 positions shown; findings below may reference images not displayed]

FINDINGS: Lower chest: Unremarkable

Hepatobiliary: No suspicious focal abnormality within the liver
parenchyma. Attenuation of liver parenchyma borderline for fatty
deposition. Gallbladder is surgically absent. No intrahepatic or
extrahepatic biliary dilation.

Pancreas: No focal mass lesion. No dilatation of the main duct. No
intraparenchymal cyst. No peripancreatic edema.

Spleen: No splenomegaly. No focal mass lesion.

Adrenals/Urinary Tract: No adrenal nodule or mass. Kidneys
unremarkable. No evidence for hydroureter. The urinary bladder
appears normal for the degree of distention.

Stomach/Bowel: Tiny hiatal hernia. Stomach otherwise unremarkable.
Duodenum is normally positioned as is the ligament of Treitz. No
small bowel wall thickening. No small bowel dilatation. The terminal
ileum is normal. The appendix is normal. No gross colonic mass. No
colonic wall thickening.

Vascular/Lymphatic: No abdominal aortic aneurysm. No abdominal
aortic atherosclerotic calcification. There is no gastrohepatic or
hepatoduodenal ligament lymphadenopathy. No intraperitoneal or
retroperitoneal lymphadenopathy. No pelvic sidewall lymphadenopathy.

Reproductive: The uterus is unremarkable.  There is no adnexal mass.

Other: No intraperitoneal free fluid.

Musculoskeletal: No worrisome lytic or sclerotic osseous
abnormality. No evidence for ventral hernia. No left upper quadrant
mass lesion.
IMPRESSION: 1. No acute findings in the abdomen or pelvis. No findings to
explain the patient's history of left upper quadrant pain and
bulging.
2. Tiny hiatal hernia.

## 2020-10-01 DIAGNOSIS — L0390 Cellulitis, unspecified: Secondary | ICD-10-CM | POA: Diagnosis not present

## 2020-10-01 DIAGNOSIS — Z6838 Body mass index (BMI) 38.0-38.9, adult: Secondary | ICD-10-CM | POA: Diagnosis not present

## 2021-02-06 DIAGNOSIS — Z0000 Encounter for general adult medical examination without abnormal findings: Secondary | ICD-10-CM | POA: Diagnosis not present

## 2021-02-06 DIAGNOSIS — Z6837 Body mass index (BMI) 37.0-37.9, adult: Secondary | ICD-10-CM | POA: Diagnosis not present

## 2021-02-06 DIAGNOSIS — E1142 Type 2 diabetes mellitus with diabetic polyneuropathy: Secondary | ICD-10-CM | POA: Diagnosis not present

## 2021-02-06 DIAGNOSIS — T148XXA Other injury of unspecified body region, initial encounter: Secondary | ICD-10-CM | POA: Diagnosis not present

## 2021-02-06 DIAGNOSIS — Z13220 Encounter for screening for lipoid disorders: Secondary | ICD-10-CM | POA: Diagnosis not present

## 2021-03-15 DIAGNOSIS — E119 Type 2 diabetes mellitus without complications: Secondary | ICD-10-CM | POA: Diagnosis not present

## 2021-06-30 DIAGNOSIS — Z1231 Encounter for screening mammogram for malignant neoplasm of breast: Secondary | ICD-10-CM | POA: Diagnosis not present

## 2021-08-21 DIAGNOSIS — Z1331 Encounter for screening for depression: Secondary | ICD-10-CM | POA: Diagnosis not present

## 2021-08-21 DIAGNOSIS — Z6838 Body mass index (BMI) 38.0-38.9, adult: Secondary | ICD-10-CM | POA: Diagnosis not present

## 2021-08-21 DIAGNOSIS — M25561 Pain in right knee: Secondary | ICD-10-CM | POA: Diagnosis not present

## 2021-09-08 DIAGNOSIS — M1711 Unilateral primary osteoarthritis, right knee: Secondary | ICD-10-CM | POA: Diagnosis not present

## 2021-09-08 DIAGNOSIS — M5416 Radiculopathy, lumbar region: Secondary | ICD-10-CM | POA: Diagnosis not present

## 2021-10-06 DIAGNOSIS — M1711 Unilateral primary osteoarthritis, right knee: Secondary | ICD-10-CM | POA: Diagnosis not present

## 2021-10-06 DIAGNOSIS — M5416 Radiculopathy, lumbar region: Secondary | ICD-10-CM | POA: Diagnosis not present

## 2022-01-25 DIAGNOSIS — R070 Pain in throat: Secondary | ICD-10-CM | POA: Diagnosis not present

## 2022-02-21 DIAGNOSIS — M25511 Pain in right shoulder: Secondary | ICD-10-CM | POA: Diagnosis not present

## 2022-02-24 DIAGNOSIS — E1165 Type 2 diabetes mellitus with hyperglycemia: Secondary | ICD-10-CM | POA: Diagnosis not present

## 2022-05-02 DIAGNOSIS — R519 Headache, unspecified: Secondary | ICD-10-CM | POA: Diagnosis not present

## 2022-05-02 DIAGNOSIS — X58XXXA Exposure to other specified factors, initial encounter: Secondary | ICD-10-CM | POA: Diagnosis not present

## 2022-05-02 DIAGNOSIS — Z7984 Long term (current) use of oral hypoglycemic drugs: Secondary | ICD-10-CM | POA: Diagnosis not present

## 2022-05-02 DIAGNOSIS — E119 Type 2 diabetes mellitus without complications: Secondary | ICD-10-CM | POA: Diagnosis not present

## 2022-05-02 DIAGNOSIS — S161XXA Strain of muscle, fascia and tendon at neck level, initial encounter: Secondary | ICD-10-CM | POA: Diagnosis not present

## 2022-05-02 DIAGNOSIS — M542 Cervicalgia: Secondary | ICD-10-CM | POA: Diagnosis not present

## 2022-05-03 DIAGNOSIS — M542 Cervicalgia: Secondary | ICD-10-CM | POA: Diagnosis not present

## 2022-05-03 DIAGNOSIS — S161XXA Strain of muscle, fascia and tendon at neck level, initial encounter: Secondary | ICD-10-CM | POA: Diagnosis not present

## 2022-05-03 DIAGNOSIS — R519 Headache, unspecified: Secondary | ICD-10-CM | POA: Diagnosis not present

## 2022-08-23 DIAGNOSIS — S161XXA Strain of muscle, fascia and tendon at neck level, initial encounter: Secondary | ICD-10-CM | POA: Diagnosis not present

## 2022-08-23 DIAGNOSIS — M069 Rheumatoid arthritis, unspecified: Secondary | ICD-10-CM | POA: Diagnosis not present

## 2022-08-23 DIAGNOSIS — E1142 Type 2 diabetes mellitus with diabetic polyneuropathy: Secondary | ICD-10-CM | POA: Diagnosis not present

## 2022-08-23 DIAGNOSIS — M542 Cervicalgia: Secondary | ICD-10-CM | POA: Diagnosis not present

## 2022-08-24 DIAGNOSIS — Z1231 Encounter for screening mammogram for malignant neoplasm of breast: Secondary | ICD-10-CM | POA: Diagnosis not present

## 2022-10-04 DIAGNOSIS — E1142 Type 2 diabetes mellitus with diabetic polyneuropathy: Secondary | ICD-10-CM | POA: Diagnosis not present

## 2022-10-04 DIAGNOSIS — E119 Type 2 diabetes mellitus without complications: Secondary | ICD-10-CM | POA: Diagnosis not present

## 2022-10-04 DIAGNOSIS — Z23 Encounter for immunization: Secondary | ICD-10-CM | POA: Diagnosis not present

## 2022-10-04 DIAGNOSIS — E782 Mixed hyperlipidemia: Secondary | ICD-10-CM | POA: Diagnosis not present

## 2022-10-09 DIAGNOSIS — E119 Type 2 diabetes mellitus without complications: Secondary | ICD-10-CM | POA: Diagnosis not present

## 2022-10-13 DIAGNOSIS — R768 Other specified abnormal immunological findings in serum: Secondary | ICD-10-CM | POA: Diagnosis not present

## 2022-10-13 DIAGNOSIS — M542 Cervicalgia: Secondary | ICD-10-CM | POA: Diagnosis not present

## 2022-10-13 DIAGNOSIS — M5412 Radiculopathy, cervical region: Secondary | ICD-10-CM | POA: Diagnosis not present

## 2022-11-15 DIAGNOSIS — E782 Mixed hyperlipidemia: Secondary | ICD-10-CM | POA: Diagnosis not present

## 2023-01-03 DIAGNOSIS — K219 Gastro-esophageal reflux disease without esophagitis: Secondary | ICD-10-CM | POA: Diagnosis not present

## 2023-01-03 DIAGNOSIS — G8929 Other chronic pain: Secondary | ICD-10-CM | POA: Diagnosis not present

## 2023-01-03 DIAGNOSIS — M1711 Unilateral primary osteoarthritis, right knee: Secondary | ICD-10-CM | POA: Diagnosis not present

## 2023-01-03 DIAGNOSIS — M5441 Lumbago with sciatica, right side: Secondary | ICD-10-CM | POA: Diagnosis not present

## 2023-01-03 DIAGNOSIS — E119 Type 2 diabetes mellitus without complications: Secondary | ICD-10-CM | POA: Diagnosis not present

## 2023-01-03 DIAGNOSIS — M4316 Spondylolisthesis, lumbar region: Secondary | ICD-10-CM | POA: Diagnosis not present

## 2023-01-03 DIAGNOSIS — M5136 Other intervertebral disc degeneration, lumbar region: Secondary | ICD-10-CM | POA: Diagnosis not present

## 2023-01-03 DIAGNOSIS — E1142 Type 2 diabetes mellitus with diabetic polyneuropathy: Secondary | ICD-10-CM | POA: Diagnosis not present

## 2023-01-03 DIAGNOSIS — E782 Mixed hyperlipidemia: Secondary | ICD-10-CM | POA: Diagnosis not present

## 2023-01-03 DIAGNOSIS — M47816 Spondylosis without myelopathy or radiculopathy, lumbar region: Secondary | ICD-10-CM | POA: Diagnosis not present

## 2023-01-18 DIAGNOSIS — M5136 Other intervertebral disc degeneration, lumbar region: Secondary | ICD-10-CM | POA: Diagnosis not present

## 2023-01-18 DIAGNOSIS — R768 Other specified abnormal immunological findings in serum: Secondary | ICD-10-CM | POA: Diagnosis not present

## 2023-01-18 DIAGNOSIS — M542 Cervicalgia: Secondary | ICD-10-CM | POA: Diagnosis not present

## 2023-01-18 DIAGNOSIS — M1711 Unilateral primary osteoarthritis, right knee: Secondary | ICD-10-CM | POA: Diagnosis not present

## 2023-01-21 DIAGNOSIS — E1142 Type 2 diabetes mellitus with diabetic polyneuropathy: Secondary | ICD-10-CM | POA: Diagnosis not present

## 2023-01-21 DIAGNOSIS — B351 Tinea unguium: Secondary | ICD-10-CM | POA: Diagnosis not present

## 2023-02-16 DIAGNOSIS — Z6835 Body mass index (BMI) 35.0-35.9, adult: Secondary | ICD-10-CM | POA: Diagnosis not present

## 2023-02-16 DIAGNOSIS — Z5320 Procedure and treatment not carried out because of patient's decision for unspecified reasons: Secondary | ICD-10-CM | POA: Diagnosis not present

## 2023-02-16 DIAGNOSIS — Z124 Encounter for screening for malignant neoplasm of cervix: Secondary | ICD-10-CM | POA: Diagnosis not present

## 2023-02-16 DIAGNOSIS — Z113 Encounter for screening for infections with a predominantly sexual mode of transmission: Secondary | ICD-10-CM | POA: Diagnosis not present

## 2023-02-16 DIAGNOSIS — Z0000 Encounter for general adult medical examination without abnormal findings: Secondary | ICD-10-CM | POA: Diagnosis not present

## 2023-02-17 DIAGNOSIS — Z0000 Encounter for general adult medical examination without abnormal findings: Secondary | ICD-10-CM | POA: Diagnosis not present

## 2023-02-17 DIAGNOSIS — Z5320 Procedure and treatment not carried out because of patient's decision for unspecified reasons: Secondary | ICD-10-CM | POA: Diagnosis not present

## 2023-02-17 DIAGNOSIS — E782 Mixed hyperlipidemia: Secondary | ICD-10-CM | POA: Diagnosis not present

## 2023-03-01 DIAGNOSIS — M5416 Radiculopathy, lumbar region: Secondary | ICD-10-CM | POA: Diagnosis not present

## 2023-03-14 DIAGNOSIS — M48061 Spinal stenosis, lumbar region without neurogenic claudication: Secondary | ICD-10-CM | POA: Diagnosis not present

## 2023-03-14 DIAGNOSIS — M79604 Pain in right leg: Secondary | ICD-10-CM | POA: Diagnosis not present

## 2023-03-14 DIAGNOSIS — M5127 Other intervertebral disc displacement, lumbosacral region: Secondary | ICD-10-CM | POA: Diagnosis not present

## 2023-03-14 DIAGNOSIS — M47816 Spondylosis without myelopathy or radiculopathy, lumbar region: Secondary | ICD-10-CM | POA: Diagnosis not present

## 2023-04-05 DIAGNOSIS — E1142 Type 2 diabetes mellitus with diabetic polyneuropathy: Secondary | ICD-10-CM | POA: Diagnosis not present

## 2023-04-05 DIAGNOSIS — E782 Mixed hyperlipidemia: Secondary | ICD-10-CM | POA: Diagnosis not present

## 2023-04-05 DIAGNOSIS — E6609 Other obesity due to excess calories: Secondary | ICD-10-CM | POA: Diagnosis not present

## 2023-04-05 DIAGNOSIS — I10 Essential (primary) hypertension: Secondary | ICD-10-CM | POA: Diagnosis not present

## 2023-04-27 DIAGNOSIS — M5416 Radiculopathy, lumbar region: Secondary | ICD-10-CM | POA: Diagnosis not present

## 2023-06-01 DIAGNOSIS — L089 Local infection of the skin and subcutaneous tissue, unspecified: Secondary | ICD-10-CM | POA: Diagnosis not present

## 2023-06-01 DIAGNOSIS — M5416 Radiculopathy, lumbar region: Secondary | ICD-10-CM | POA: Diagnosis not present

## 2023-06-11 DIAGNOSIS — R051 Acute cough: Secondary | ICD-10-CM | POA: Diagnosis not present

## 2023-06-11 DIAGNOSIS — J019 Acute sinusitis, unspecified: Secondary | ICD-10-CM | POA: Diagnosis not present

## 2023-06-11 DIAGNOSIS — R0981 Nasal congestion: Secondary | ICD-10-CM | POA: Diagnosis not present
